# Patient Record
Sex: Male | Born: 1955 | Race: White | Hispanic: No | Marital: Married | Smoking: Never smoker
Health system: Southern US, Community
[De-identification: ages and names within clinical notes are randomized; demographics above are authoritative.]

## PROBLEM LIST (undated history)

## (undated) DIAGNOSIS — E785 Hyperlipidemia, unspecified: Secondary | ICD-10-CM

## (undated) DIAGNOSIS — B019 Varicella without complication: Secondary | ICD-10-CM

## (undated) DIAGNOSIS — J302 Other seasonal allergic rhinitis: Secondary | ICD-10-CM

## (undated) HISTORY — DX: Varicella without complication: B01.9

## (undated) HISTORY — PX: SKULL FRACTURE ELEVATION: SHX781

## (undated) HISTORY — DX: Other seasonal allergic rhinitis: J30.2

## (undated) HISTORY — DX: Hyperlipidemia, unspecified: E78.5

---

## 2006-01-18 ENCOUNTER — Ambulatory Visit: Payer: Self-pay | Admitting: Internal Medicine

## 2007-05-18 ENCOUNTER — Ambulatory Visit: Payer: Self-pay | Admitting: Internal Medicine

## 2007-05-18 ENCOUNTER — Encounter: Payer: Self-pay | Admitting: Internal Medicine

## 2007-05-18 DIAGNOSIS — M79609 Pain in unspecified limb: Secondary | ICD-10-CM | POA: Insufficient documentation

## 2007-05-24 ENCOUNTER — Ambulatory Visit: Payer: Self-pay | Admitting: Internal Medicine

## 2007-06-08 ENCOUNTER — Encounter: Admission: RE | Admit: 2007-06-08 | Discharge: 2007-06-08 | Payer: Self-pay | Admitting: Internal Medicine

## 2007-06-11 ENCOUNTER — Telehealth (INDEPENDENT_AMBULATORY_CARE_PROVIDER_SITE_OTHER): Payer: Self-pay | Admitting: *Deleted

## 2007-06-15 LAB — CONVERTED CEMR LAB
Albumin: 3.6 g/dL (ref 3.5–5.2)
Basophils Absolute: 0 10*3/uL (ref 0.0–0.1)
Bilirubin, Direct: 0.1 mg/dL (ref 0.0–0.3)
Cholesterol: 160 mg/dL (ref 0–200)
Eosinophils Absolute: 0.2 10*3/uL (ref 0.0–0.6)
Eosinophils Relative: 3.1 % (ref 0.0–5.0)
GFR calc Af Amer: 101 mL/min
GFR calc non Af Amer: 84 mL/min
Glucose, Bld: 89 mg/dL (ref 70–99)
HCT: 40.6 % (ref 39.0–52.0)
Lymphocytes Relative: 24.6 % (ref 12.0–46.0)
MCHC: 35.1 g/dL (ref 30.0–36.0)
MCV: 89.9 fL (ref 78.0–100.0)
Neutro Abs: 4.7 10*3/uL (ref 1.4–7.7)
Neutrophils Relative %: 64.9 % (ref 43.0–77.0)
PSA: 1.1 ng/mL (ref 0.10–4.00)
Platelets: 276 10*3/uL (ref 150–400)
RBC: 4.52 M/uL (ref 4.22–5.81)
Sodium: 141 meq/L (ref 135–145)
TSH: 2.54 microintl units/mL (ref 0.35–5.50)
Total CHOL/HDL Ratio: 4.1
Triglycerides: 89 mg/dL (ref 0–149)
WBC: 7.2 10*3/uL (ref 4.5–10.5)

## 2007-07-24 ENCOUNTER — Telehealth (INDEPENDENT_AMBULATORY_CARE_PROVIDER_SITE_OTHER): Payer: Self-pay | Admitting: *Deleted

## 2007-08-06 ENCOUNTER — Encounter: Payer: Self-pay | Admitting: Internal Medicine

## 2007-08-10 ENCOUNTER — Encounter: Payer: Self-pay | Admitting: Internal Medicine

## 2008-03-19 IMAGING — US US SCROTUM
1 series · 14 of 25 positions shown · non-contrast
Comparison: none

CLINICAL DATA: Groin pain.  Rule out hydrocele.
 SCROTAL ULTRASOUND:
TECHNIQUE: Complete ultrasound examination of the testicles, epididymis, and other scrotal structures was performed.

[Series 1: unknown · 0.09mm/px · 14 of 32 slices shown]
[im 1/32]
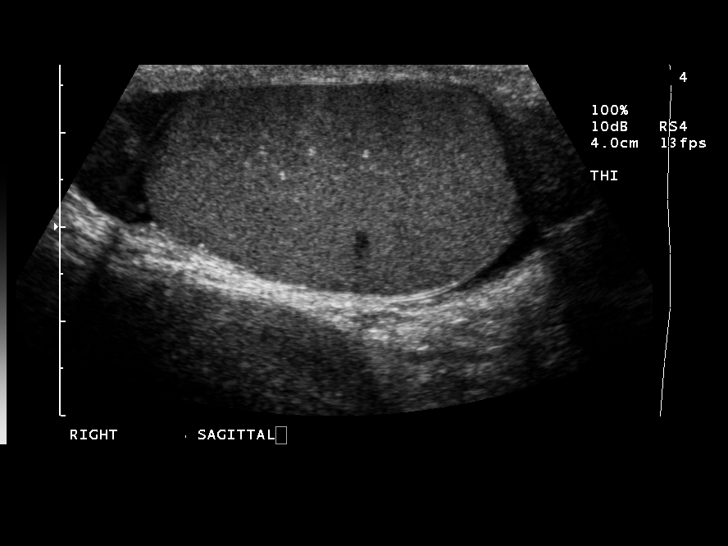
[im 3/32]
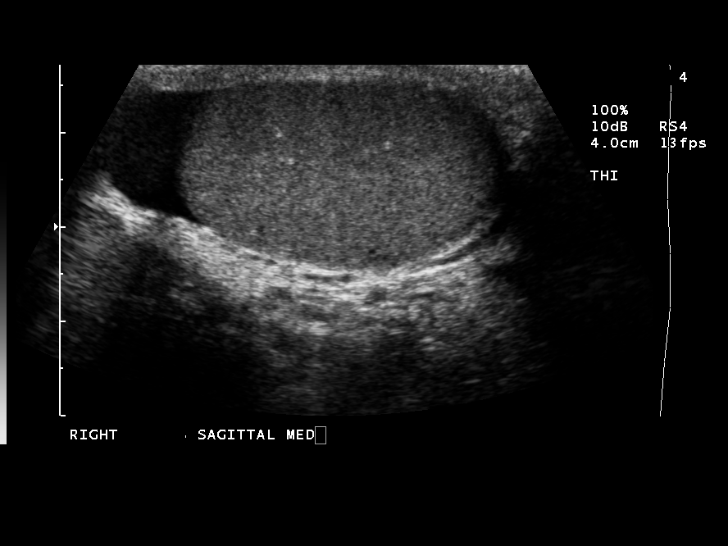
[im 6/32]
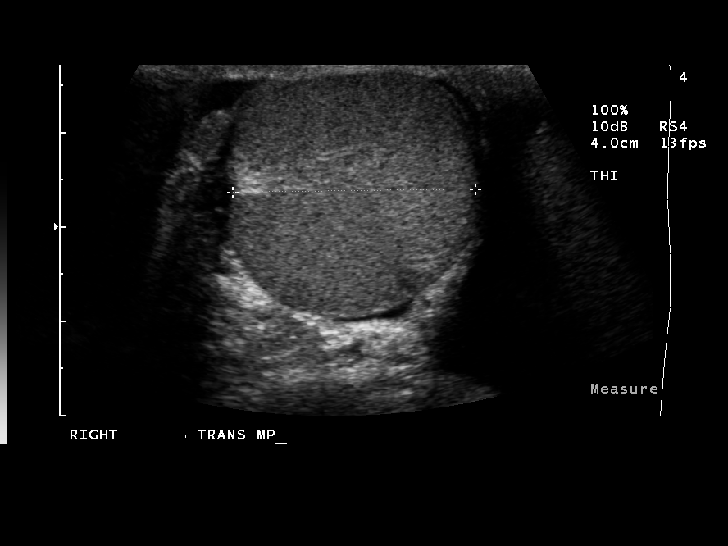
[im 8/32]
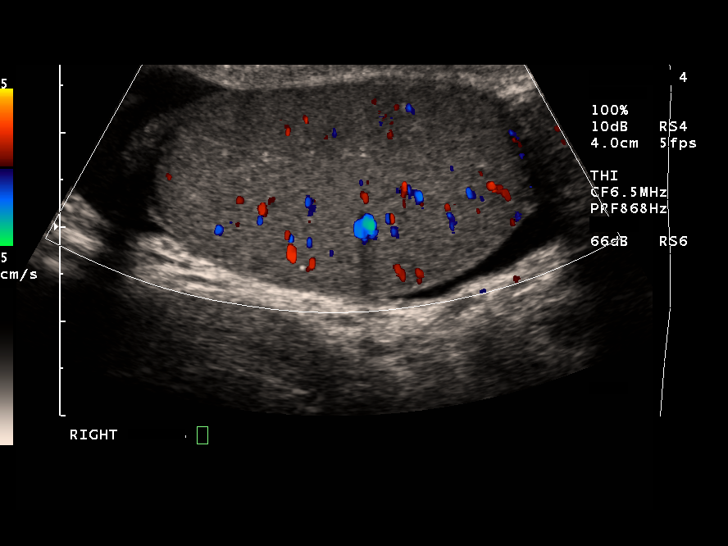
[im 11/32]
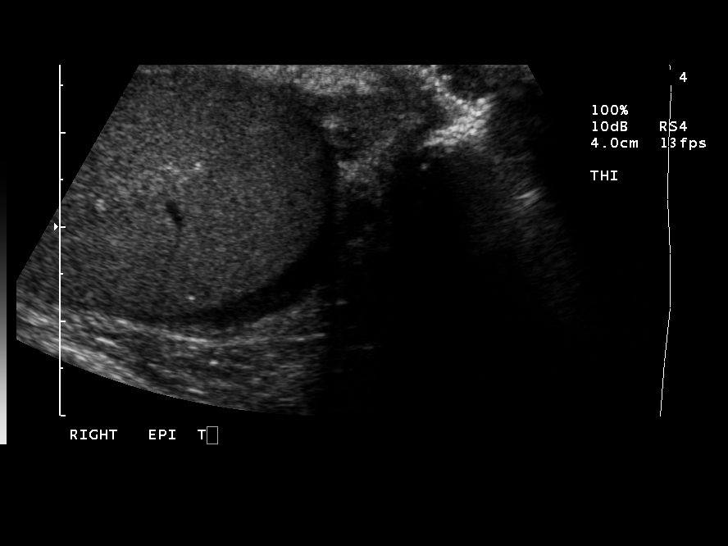
[im 12/32]
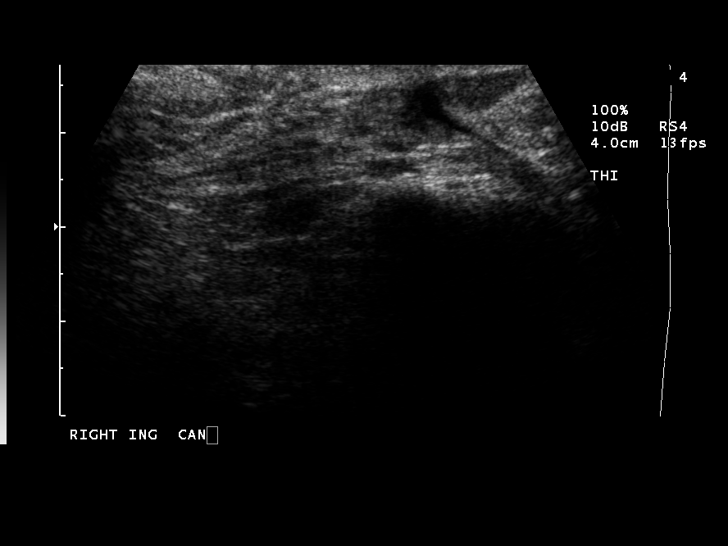
[im 15/32]
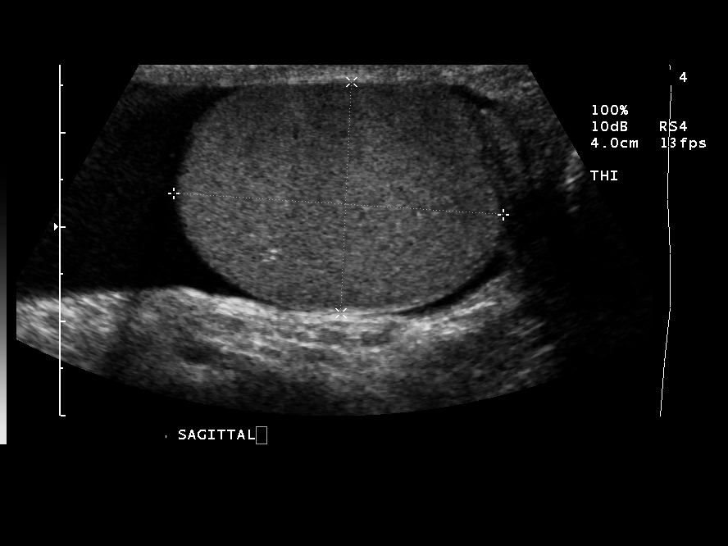
[im 17/32]
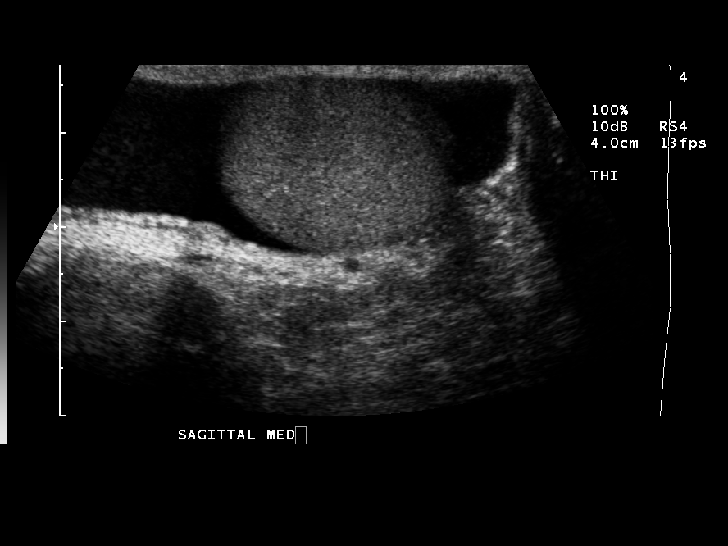
[im 20/32]
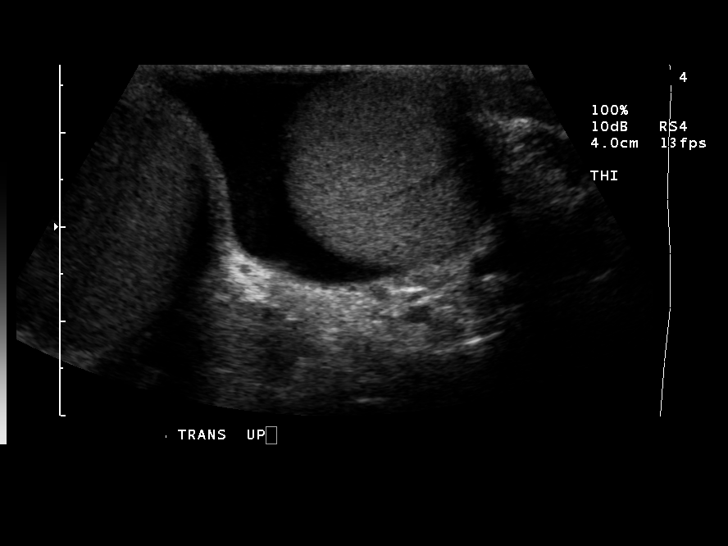
[im 21/32]
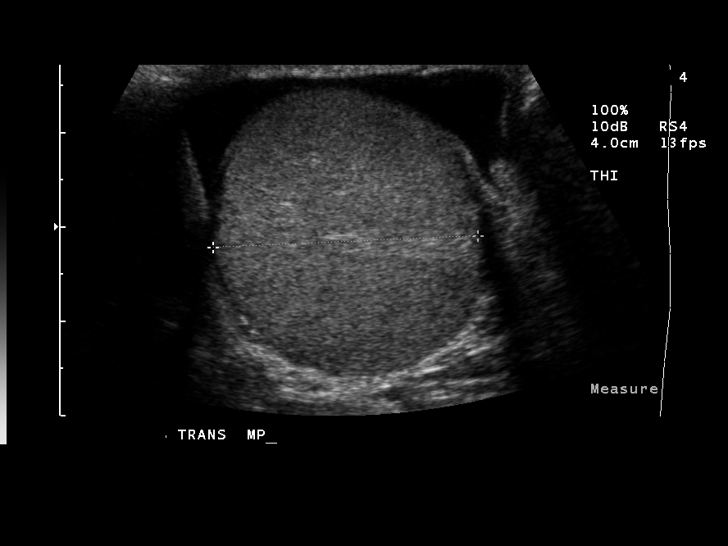
[im 24/32]
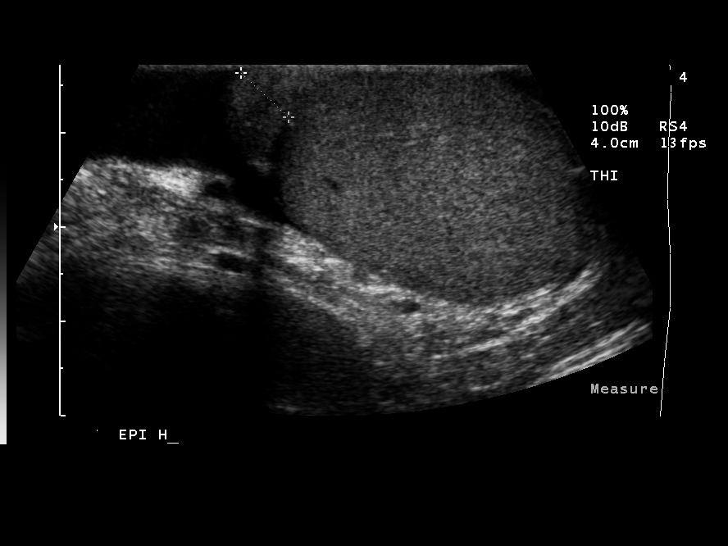
[im 26/32]
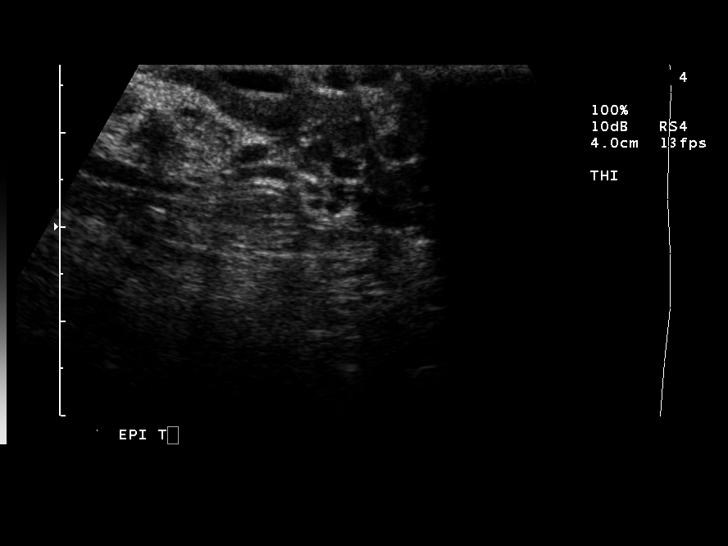
[im 29/32]
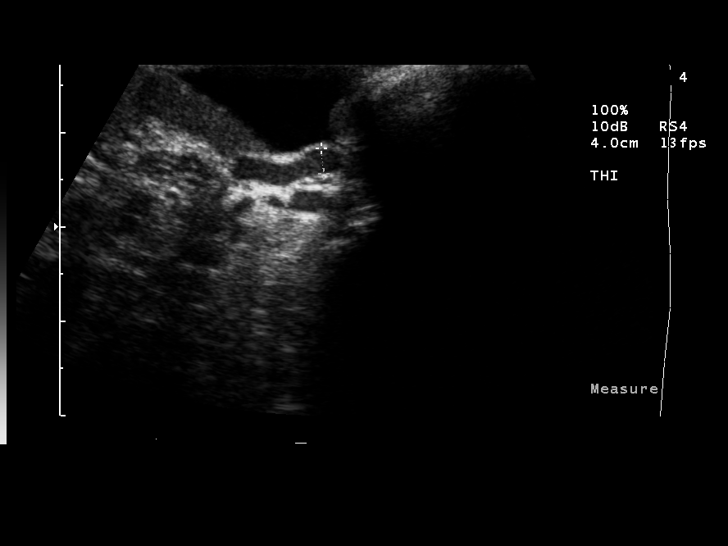
[im 32/32]
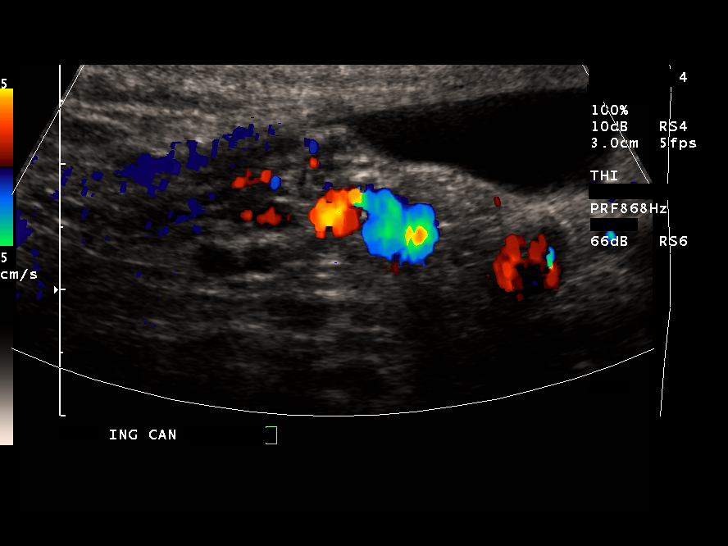

[14 of 25 positions shown; findings below may reference images not displayed]

FINDINGS: The right testicle measures 3.9 x 3.4 x 2.6 cm and appears normal without mass.  Normal blood flow by Doppler.  There is a small right hydrocele.  
 The left testicle measures 3.5 x 2.6 x 2.8 cm and appears normal without mass.  There is normal flow to the left testicle by Doppler.  There is a moderate left hydrocele.  There is a moderate varicocele on the left with increased blood flow with Valsalva maneuver.
IMPRESSION: Bilateral hydrocele, left greater than right.  Left varicocele.

## 2012-11-01 ENCOUNTER — Ambulatory Visit (INDEPENDENT_AMBULATORY_CARE_PROVIDER_SITE_OTHER): Payer: 59 | Admitting: Family Medicine

## 2012-11-01 VITALS — BP 122/79 | HR 64 | Temp 98.3°F | Resp 16 | Ht 70.0 in | Wt 181.0 lb

## 2012-11-01 DIAGNOSIS — S01512A Laceration without foreign body of oral cavity, initial encounter: Secondary | ICD-10-CM

## 2012-11-01 DIAGNOSIS — S01502A Unspecified open wound of oral cavity, initial encounter: Secondary | ICD-10-CM

## 2012-11-01 DIAGNOSIS — K146 Glossodynia: Secondary | ICD-10-CM

## 2012-11-01 DIAGNOSIS — Z23 Encounter for immunization: Secondary | ICD-10-CM

## 2012-11-01 MED ORDER — PENICILLIN V POTASSIUM 500 MG PO TABS: 500.0000 mg | ORAL_TABLET | Freq: Three times a day (TID) | ORAL | Status: DC

## 2012-11-01 MED ORDER — PREDNISONE 20 MG PO TABS: ORAL_TABLET | ORAL | Status: DC

## 2012-11-01 NOTE — Progress Notes (Signed)
Patient ID: LOT MEDFORD MRN: [id number], DOB: April 14, 1956, 56 y.o. Date of Encounter: 11/01/2012, 9:40 AM  Primary Physician: No primary provider on file.  Chief Complaint: Tongue laceration  HPI: 56 y.o. year old male with history below presents with laceration of the tongue. Injury occurred this morning around 6:30 AM while he was eating his shredded wheat. He noticed some blood initially but continued on with his morning. When he arrived at work he continued to notice some blood and went into the bathroom. It was at that time that he noticed a flap along the middle of his tongue that continues to bleed some. The flap continues to bother him. No difficulty breathing or swallowing. Continues to taste blood. Has not noticed that his tongue has swollen. No difficulty talking. He is not sure when his last tetanus vaccine was.    History reviewed. No pertinent past medical history.   Home Meds: Prior to Admission medications   Not on File    Allergies: No Known Allergies  History   Social History  . Marital Status: Single    Spouse Name: N/A    Number of Children: N/A  . Years of Education: N/A   Occupational History  . Not on file.   Social History Main Topics  . Smoking status: Never Smoker   . Smokeless tobacco: Not on file  . Alcohol Use: No  . Drug Use: No  . Sexually Active: Yes     Comment: married   Other Topics Concern  . Not on file   Social History Narrative  . No narrative on file     Review of Systems: Constitutional: negative for chills, fever, night sweats, weight changes, or fatigue  HEENT:see above Respiratory: negative for cough, wheezing, shortness of breath, dyspnea, or hemoptysis    Physical Exam: Blood pressure 142/80, pulse 70, temperature 98.3 F (36.8 C), temperature source Oral, resp. rate 16, height 5\' 10"  (1.778 m), weight 181 lb (82.101 kg), SpO2 99.00%., Body mass index is 25.97 kg/(m^2). General: Well developed, well nourished, in  no acute distress. Head: Normocephalic, atraumatic, eyes without discharge, sclera non-icteric, nares are without discharge. Bilateral auditory canals clear, TM's are without perforation, pearly grey and translucent with reflective cone of light bilaterally. Tongue with laceration and flap midline. Small amount of active bleeding. Dried blood lateral to laceration. No damage to teeth. Oral cavity moist, posterior pharynx without exudate, erythema, peritonsillar abscess, or post nasal drip. Posterior pharynx without erythema or swelling. Neck: Supple. No thyromegaly. Full ROM. No lymphadenopathy. No stridor.   Lungs: Clear bilaterally to auscultation without wheezes, rales, or rhonchi. Breathing is unlabored. Heart: RRR with S1 S2. No murmurs, rubs, or gallops appreciated. Msk:  Strength and tone normal for age. Extremities/Skin: Warm and dry. No clubbing or cyanosis. No edema. No rashes or suspicious lesions. Neuro: Alert and oriented X 3. Moves all extremities spontaneously. Gait is normal. CNII-XII grossly in tact. Psych:  Responds to questions appropriately with a normal affect.     PROCEDURE NOTE: Verbal consent obtained. Sterile technique employed. Mouth rinsed out with 3 bottles of water. Numbing: Anesthesia obtained with 2% plain lidocaine 1.5 cc.   Wound explored, no deep structures involved, no foreign bodies.   Wound repaired with # 2 simple interrupted sutures 4-0 vicryl. Wound loosely approximated to allow for STS. Hemostasis obtained.   Wound care instructions including precautions covered with patient.      ASSESSMENT AND PLAN:  56 y.o. year old male with tongue  laceration. -Laceration repaired per above -TDaP today -Penicillin VK 500 mg 1 po tid #30 no RF -Prednisone 20 mg #12 3x2, 2x2, 1x2 no RF -Patient did have two pre-vagal episodes while placing sutures. Never with vagal episode. BP and pulse at discharge 122/79, 64. At discharge patient reports that he feels  fine. -Patient given fast track to recheck either on 11/9 or 11/10.  -RTC precautions given -Angioedema/tongue swelling precautions discussed in detail -Questions/concerns answered -Patient discussed with and examined by Dr. Patsy Lager   Signed, Eula Listen, PA-C 11/01/2012 9:40 AM

## 2017-10-11 ENCOUNTER — Ambulatory Visit: Payer: Self-pay | Admitting: Family Medicine

## 2018-09-12 ENCOUNTER — Encounter: Payer: Self-pay | Admitting: Family Medicine

## 2018-09-12 ENCOUNTER — Ambulatory Visit (INDEPENDENT_AMBULATORY_CARE_PROVIDER_SITE_OTHER): Payer: 59 | Admitting: Family Medicine

## 2018-09-12 VITALS — BP 136/86 | HR 60 | Temp 98.0°F | Ht 69.5 in | Wt 183.4 lb

## 2018-09-12 DIAGNOSIS — Z23 Encounter for immunization: Secondary | ICD-10-CM | POA: Diagnosis not present

## 2018-09-12 DIAGNOSIS — Z125 Encounter for screening for malignant neoplasm of prostate: Secondary | ICD-10-CM

## 2018-09-12 DIAGNOSIS — Z114 Encounter for screening for human immunodeficiency virus [HIV]: Secondary | ICD-10-CM | POA: Diagnosis not present

## 2018-09-12 DIAGNOSIS — E785 Hyperlipidemia, unspecified: Secondary | ICD-10-CM | POA: Diagnosis not present

## 2018-09-12 DIAGNOSIS — Z1159 Encounter for screening for other viral diseases: Secondary | ICD-10-CM | POA: Diagnosis not present

## 2018-09-12 DIAGNOSIS — Z0000 Encounter for general adult medical examination without abnormal findings: Secondary | ICD-10-CM

## 2018-09-12 DIAGNOSIS — Z1211 Encounter for screening for malignant neoplasm of colon: Secondary | ICD-10-CM

## 2018-09-12 LAB — COMPREHENSIVE METABOLIC PANEL
ALBUMIN: 3.8 g/dL (ref 3.5–5.2)
ALT: 12 U/L (ref 0–53)
AST: 16 U/L (ref 0–37)
Alkaline Phosphatase: 65 U/L (ref 39–117)
BUN: 20 mg/dL (ref 6–23)
CHLORIDE: 107 meq/L (ref 96–112)
CO2: 28 mEq/L (ref 19–32)
CREATININE: 0.95 mg/dL (ref 0.40–1.50)
Calcium: 9 mg/dL (ref 8.4–10.5)
GFR: 85.23 mL/min (ref >60.00)
Glucose, Bld: 86 mg/dL (ref 70–99)
Potassium: 4.2 mEq/L (ref 3.5–5.1)
SODIUM: 140 meq/L (ref 135–145)
TOTAL PROTEIN: 6.6 g/dL (ref 6.0–8.3)
Total Bilirubin: 0.4 mg/dL (ref 0.2–1.2)

## 2018-09-12 LAB — POC URINALSYSI DIPSTICK (AUTOMATED)
Bilirubin, UA: NEGATIVE
Glucose, UA: NEGATIVE
KETONES UA: NEGATIVE
LEUKOCYTES UA: NEGATIVE
Nitrite, UA: NEGATIVE
PH UA: 5.5 (ref 5.0–8.0)
PROTEIN UA: NEGATIVE
RBC UA: NEGATIVE
Spec Grav, UA: 1.025 (ref 1.010–1.025)
Urobilinogen, UA: 0.2 E.U./dL

## 2018-09-12 LAB — LIPID PANEL
CHOLESTEROL: 153 mg/dL (ref 0–200)
HDL: 40.5 mg/dL (ref >39.00)
LDL CALC: 92 mg/dL (ref 0–99)
NonHDL: 112.18
Total CHOL/HDL Ratio: 4
Triglycerides: 102 mg/dL (ref 0.0–149.0)
VLDL: 20.4 mg/dL (ref 0.0–40.0)

## 2018-09-12 LAB — CBC
HCT: 42.9 % (ref 39.0–52.0)
Hemoglobin: 14.4 g/dL (ref 13.0–17.0)
MCHC: 33.5 g/dL (ref 30.0–36.0)
MCV: 91 fl (ref 78.0–100.0)
PLATELETS: 264 10*3/uL (ref 150.0–400.0)
RBC: 4.71 Mil/uL (ref 4.22–5.81)
RDW: 13.9 % (ref 11.5–15.5)
WBC: 6.7 10*3/uL (ref 4.0–10.5)

## 2018-09-12 LAB — PSA: PSA: 3.52 ng/mL (ref 0.10–4.00)

## 2018-09-12 NOTE — Addendum Note (Signed)
Addended by: London SheerFRIZZELL, BAILEY T on: 09/12/2018 09:07 AM   Modules accepted: Orders
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
## 2018-09-12 NOTE — Addendum Note (Signed)
Addended by: Heide SparkSOUTHERN HIZER, JAMIE M on: 09/12/2018 09:06 AM   Modules accepted: Orders

## 2018-09-12 NOTE — Patient Instructions (Addendum)
Health Maintenance Due  Topic Date Due  . Hepatitis C Screening - ordered today 1956/07/10  . HIV Screening - ordered today 02/27/1971  . COLONOSCOPY - We will call you within two weeks about your referral to Dr. Elnoria HowardHung of GI. If you do not hear within 3 weeks, give us a call.  02/26/2006   Advise you to get back in with a new dentist.   Tdap today.   Declined flu.   Please stop by lab before you go

## 2018-09-13 LAB — HIV ANTIBODY (ROUTINE TESTING W REFLEX): HIV: NONREACTIVE

## 2018-09-13 LAB — HEPATITIS C ANTIBODY
HEP C AB: NONREACTIVE
SIGNAL TO CUT-OFF: 0.02 (ref <1.00)

## 2018-09-14 ENCOUNTER — Telehealth: Payer: Self-pay | Admitting: Family Medicine

## 2018-09-14 NOTE — Telephone Encounter (Signed)
Left message on voicemail for pt to return call to office for results.    

## 2018-09-14 NOTE — Telephone Encounter (Signed)
Pt called to get results.  Copied from CRM [phone]. Topic: Quick Communication - Lab Results >> Sep 14, 2018 11:12 AM [place] Hizer, [place], [place] wrote: Called patient to inform them of 09/13/18 lab results. When patient returns call, triage nurse may disclose results.

## 2018-10-02 NOTE — Telephone Encounter (Signed)
Pt given lab results per notes of Dr Durene Cal on 09/13/18. Pt verbalized understanding. Pt will call back later with if he wants the 3-6 month appointment.  Unable to document in result note. Result note not routed.

## 2018-10-12 ENCOUNTER — Telehealth: Payer: Self-pay | Admitting: Family Medicine

## 2018-10-12 NOTE — Telephone Encounter (Signed)
Copied from CRM [id number]. Topic: Quick Communication - See Telephone Encounter >> Oct 12, 2018 12:14 PM Windy Kalata, NT wrote: CRM for notification. See Telephone encounter for: 10/12/18.  Patient is calling and states when he received his lab results from 09/12/18 visit he was told his PSA was high and that if he wanted to come back and get it rechecked he could or he could wait. He wants a nurse to give him a call and explain what exactly that is and when he should have it retested.

## 2018-10-12 NOTE — Telephone Encounter (Signed)
See note

## 2018-10-18 NOTE — Telephone Encounter (Signed)
See note

## 2018-10-18 NOTE — Telephone Encounter (Signed)
Per the lab results dated 09/12/18:  PSA has increased by 2.5 in 10-11 years. You do have an enlarged prostate which can cause this. If you would like we could do a 3-6 month under BPH with nocturia (enlarged prostate) repeat to make sure this isnt trending up or we can simply do a rectal and PSA check next year.   Called patient and left a voicemail message asking for a return phone call

## 2018-10-18 NOTE — Telephone Encounter (Signed)
Patient called, left VM to return call to the office to speak to a TN about PSA lab results.

## 2018-10-18 NOTE — Telephone Encounter (Signed)
Patient return call, advised of the note below. I explained that he can either come in for a repeat PSA blood level in 3-6 months or come back in 1 year for a physical/labs, which will include a rectal exam and PSA level at that time. He asks what does the 2.5 mean, I explained that based on his level 11 years ago, his number last month was 2.5 points higher, he verbalized understanding and says he wants to come in 3 months to have the labs repeated. I advised someone from the office will call with the lab appointment once the order has been put in.

## 2018-10-22 ENCOUNTER — Other Ambulatory Visit: Payer: Self-pay

## 2018-10-22 DIAGNOSIS — R972 Elevated prostate specific antigen [PSA]: Secondary | ICD-10-CM

## 2018-10-22 NOTE — Telephone Encounter (Signed)
Order entered for future PSA.

## 2018-12-12 ENCOUNTER — Other Ambulatory Visit (INDEPENDENT_AMBULATORY_CARE_PROVIDER_SITE_OTHER): Payer: 59

## 2018-12-12 DIAGNOSIS — R972 Elevated prostate specific antigen [PSA]: Secondary | ICD-10-CM | POA: Diagnosis not present

## 2018-12-12 LAB — PSA: PSA: 3.86 ng/mL (ref 0.10–4.00)

## 2018-12-21 ENCOUNTER — Other Ambulatory Visit: Payer: Self-pay

## 2018-12-21 ENCOUNTER — Telehealth: Payer: Self-pay | Admitting: Family Medicine

## 2018-12-21 DIAGNOSIS — R972 Elevated prostate specific antigen [PSA]: Secondary | ICD-10-CM

## 2018-12-21 NOTE — Telephone Encounter (Signed)
Called and spoke with patient. I reviewed over his lab results with him and I placed a referral to Urology. Patient is aware to look for their office to call him to schedule an appointment

## 2018-12-21 NOTE — Telephone Encounter (Signed)
Copied from CRM [phone]. Topic: General - Other >> Dec 21, 2018 12:17 PM Fanny BienIlderton, Jessica L wrote: Reason for CRM: pt requesting labs from 12/12/18. Please advise

## 2019-02-08 DIAGNOSIS — R972 Elevated prostate specific antigen [PSA]: Secondary | ICD-10-CM | POA: Diagnosis not present

## 2019-06-11 ENCOUNTER — Telehealth: Payer: Self-pay | Admitting: Physical Therapy

## 2019-06-11 DIAGNOSIS — R972 Elevated prostate specific antigen [PSA]: Secondary | ICD-10-CM

## 2019-06-11 NOTE — Telephone Encounter (Signed)
We referred him to urology last year- what is the status of this referral?  I was unable to locate records of urology visit.  Patient may be able to give perspective on this as well

## 2019-06-11 NOTE — Telephone Encounter (Signed)
Copied from [place] [id number]. Topic: General - Other >> Jun 11, 2019 12:11 PM Marin Olp L wrote: Reason for CRM: PSA blood draw being requested. Please call once order is placed.

## 2019-06-12 NOTE — Telephone Encounter (Signed)
Called pt and left VM to call the office.  

## 2019-06-13 NOTE — Telephone Encounter (Signed)
Forwarding to Dr. Hunter to advise.  

## 2019-06-13 NOTE — Telephone Encounter (Signed)
Future lab order placed. Called pt and schedule lab visit for tomorrow at 11:30.

## 2019-06-13 NOTE — Telephone Encounter (Signed)
Pt was not able to go to urology due to work/scheduling issues. He said he wanted to do labs again with Dr. Yong Channel and then will f/u with the urologist after that if needed. Please advise.

## 2019-06-13 NOTE — Telephone Encounter (Signed)
May order PSA under elevated PSA Lab Results  Component Value Date   PSA 3.86 12/12/2018   PSA 3.52 09/12/2018   PSA 1.10 05/24/2007

## 2019-06-13 NOTE — Addendum Note (Signed)
Addended by: Jasper Loser on: 06/13/2019 04:01 PM   Modules accepted: Orders

## 2019-06-14 ENCOUNTER — Other Ambulatory Visit (INDEPENDENT_AMBULATORY_CARE_PROVIDER_SITE_OTHER): Payer: 59

## 2019-06-14 ENCOUNTER — Other Ambulatory Visit: Payer: Self-pay

## 2019-06-14 DIAGNOSIS — R972 Elevated prostate specific antigen [PSA]: Secondary | ICD-10-CM

## 2019-06-14 LAB — PSA: PSA: 5.15 ng/mL — ABNORMAL HIGH (ref 0.10–4.00)

## 2019-06-17 ENCOUNTER — Other Ambulatory Visit: Payer: Self-pay | Admitting: Physical Therapy

## 2019-06-17 ENCOUNTER — Ambulatory Visit: Payer: Self-pay

## 2019-06-17 DIAGNOSIS — R972 Elevated prostate specific antigen [PSA]: Secondary | ICD-10-CM

## 2019-06-17 NOTE — Telephone Encounter (Signed)
Provided lab notes of 06/14/19 Pt.  Voices understanding.  Had to cancel  Appt.  Will reschedule.

## 2021-04-22 LAB — HM COLONOSCOPY

## 2021-04-23 ENCOUNTER — Encounter: Payer: Self-pay | Admitting: Family Medicine
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
## 2021-08-27 ENCOUNTER — Ambulatory Visit (INDEPENDENT_AMBULATORY_CARE_PROVIDER_SITE_OTHER): Payer: 59 | Admitting: Family Medicine

## 2021-08-27 ENCOUNTER — Encounter: Payer: Self-pay | Admitting: Family Medicine

## 2021-08-27 ENCOUNTER — Other Ambulatory Visit: Payer: Self-pay

## 2021-08-27 VITALS — BP 137/83 | HR 61 | Temp 98.5°F | Ht 69.5 in | Wt 195.2 lb

## 2021-08-27 DIAGNOSIS — Z0000 Encounter for general adult medical examination without abnormal findings: Secondary | ICD-10-CM

## 2021-08-27 DIAGNOSIS — R972 Elevated prostate specific antigen [PSA]: Secondary | ICD-10-CM | POA: Diagnosis not present

## 2021-08-27 DIAGNOSIS — E785 Hyperlipidemia, unspecified: Secondary | ICD-10-CM

## 2021-08-27 NOTE — Patient Instructions (Addendum)
Health Maintenance Due  Topic Date Due   Zoster Vaccines- Shingrix (1 of 2) - hold off shingles shot at this time. Never done   PNA vac Low Risk Adult (1 of 2 - PCV13) - hold off Prevnar 20 shot at this time  Never done  -Hold off COVID shot at this time.  -Hold off flu shot at this time.  We will call you within two weeks about your referral to Urology. If you do not hear within 2 weeks, give [place] a call.   Please consider trying Melatonin  1-3 mg 30 minutes before bed to help with sleep for up to 2 weeks  Please stop by lab before you go- we did labs today- should not need work labs If you have mychart- we will send your results within 3 business days of [place] receiving them.  If you do not have mychart- we will call you about results within 5 business days of [place] receiving them.  *please also note that you will see labs on mychart as soon as they post. I will later go in and write notes on them- will say "notes from Dr. Durene Cal"  Recommended follow up: Return in about 1 year (around 08/27/2022) for physical or sooner if needed.

## 2021-08-27 NOTE — Addendum Note (Signed)
Addended by: Lorn Junes on: 08/27/2021 03:40 PM   Modules accepted: Orders

## 2021-08-28 LAB — COMPREHENSIVE METABOLIC PANEL
AG Ratio: 1.5 (calc) (ref 1.0–2.5)
ALT: 13 U/L (ref 9–46)
AST: 18 U/L (ref 10–35)
Albumin: 3.8 g/dL (ref 3.6–5.1)
Alkaline phosphatase (APISO): 51 U/L (ref 35–144)
BUN: 16 mg/dL (ref 7–25)
CO2: 28 mmol/L (ref 20–32)
Calcium: 9.4 mg/dL (ref 8.6–10.3)
Chloride: 106 mmol/L (ref 98–110)
Creat: 1.07 mg/dL (ref 0.70–1.35)
Globulin: 2.6 g/dL (calc) (ref 1.9–3.7)
Glucose, Bld: 91 mg/dL (ref 65–99)
Potassium: 4.2 mmol/L (ref 3.5–5.3)
Sodium: 140 mmol/L (ref 135–146)
Total Bilirubin: 0.6 mg/dL (ref 0.2–1.2)
Total Protein: 6.4 g/dL (ref 6.1–8.1)

## 2021-08-28 LAB — LIPID PANEL
Cholesterol: 182 mg/dL (ref <200)
HDL: 44 mg/dL (ref >=40)
LDL Cholesterol (Calc): 116 mg/dL (calc) — ABNORMAL HIGH
Non-HDL Cholesterol (Calc): 138 mg/dL (calc) — ABNORMAL HIGH (ref <130)
Total CHOL/HDL Ratio: 4.1 (calc) (ref <5.0)
Triglycerides: 114 mg/dL (ref <150)

## 2021-08-28 LAB — CBC WITH DIFFERENTIAL/PLATELET
Absolute Monocytes: 593 cells/uL (ref 200–950)
Basophils Absolute: 68 cells/uL (ref 0–200)
Basophils Relative: 0.9 %
Eosinophils Absolute: 278 cells/uL (ref 15–500)
Eosinophils Relative: 3.7 %
HCT: 45.3 % (ref 38.5–50.0)
Hemoglobin: 14.4 g/dL (ref 13.2–17.1)
Lymphs Abs: 2138 cells/uL (ref 850–3900)
MCH: 29.4 pg (ref 27.0–33.0)
MCHC: 31.8 g/dL — ABNORMAL LOW (ref 32.0–36.0)
MCV: 92.6 fL (ref 80.0–100.0)
MPV: 10.8 fL (ref 7.5–12.5)
Monocytes Relative: 7.9 %
Neutro Abs: 4425 cells/uL (ref 1500–7800)
Neutrophils Relative %: 59 %
Platelets: 261 10*3/uL (ref 140–400)
RBC: 4.89 10*6/uL (ref 4.20–5.80)
RDW: 12.4 % (ref 11.0–15.0)
Total Lymphocyte: 28.5 %
WBC: 7.5 10*3/uL (ref 3.8–10.8)

## 2021-08-28 LAB — PSA: PSA: 4.58 ng/mL — ABNORMAL HIGH (ref <=4.00)

## 2021-10-29 ENCOUNTER — Encounter: Payer: Self-pay | Admitting: Family Medicine

## 2021-10-29 NOTE — Telephone Encounter (Signed)
Pt's wife called wanting an update on forms. Pt wanted to know if Dr Durene Cal has received forms. Please Advise.

## 2021-11-01 NOTE — Telephone Encounter (Signed)
Have these been completed

## 2022-09-19 ENCOUNTER — Encounter: Payer: Self-pay | Admitting: *Deleted

## 2022-12-08 ENCOUNTER — Encounter: Payer: Self-pay | Admitting: *Deleted

## 2023-05-05 ENCOUNTER — Other Ambulatory Visit: Payer: Self-pay | Admitting: Urology

## 2023-05-05 DIAGNOSIS — R972 Elevated prostate specific antigen [PSA]: Secondary | ICD-10-CM

## 2023-06-22 ENCOUNTER — Ambulatory Visit
Admission: RE | Admit: 2023-06-22 | Discharge: 2023-06-22 | Disposition: A | Discharge status: Home / Self Care | Payer: Medicare Other | Source: Ambulatory Visit | Attending: Urology | Admitting: Urology

## 2023-06-22 DIAGNOSIS — R972 Elevated prostate specific antigen [PSA]: Secondary | ICD-10-CM

## 2023-06-22 MED ORDER — GADOPICLENOL 0.5 MMOL/ML IV SOLN
8.0000 mL | Freq: Once | INTRAVENOUS | Status: AC | PRN
  Administered 2023-06-22: 8 mL via INTRAVENOUS

## 2024-11-06 ENCOUNTER — Telehealth: Payer: Self-pay

## 2024-11-06 NOTE — Telephone Encounter (Signed)
 Patient is overdue for an appointment. LMOM for patient to call and schedule.

## 2024-11-12 ENCOUNTER — Telehealth: Payer: Self-pay

## 2024-11-12 NOTE — Telephone Encounter (Signed)
 Patient is overdue for an appointment with PCP. LMOM for patient to call and schedule.

## 2024-11-15 ENCOUNTER — Telehealth: Payer: Self-pay | Admitting: Family Medicine

## 2024-11-15 NOTE — Telephone Encounter (Signed)
Happy to see him back

## 2024-11-15 NOTE — Telephone Encounter (Signed)
 Please see note and advise.   Copied from CRM [id number]. Topic: Appointments - Scheduling Inquiry for Clinic >> Nov 15, 2024 12:30 PM Melissa C wrote: Reason for CRM: patient returning a call that he recently received. I saw where he he had received a call for scheduling and that he is overdue for scheduling, however since he has not been scheduled with PCP for over 3 years he is now considered a new patient and Dr. Katrinka is not taking new patients. I had placed patient on hold while looking through his chart and he hung up. I attempted to call him back and he did not answer. Please give patient a call back if Dr. Katrinka is able to see him as a patient or if he needs to see a different provider.

## 2024-11-19 NOTE — Telephone Encounter (Signed)
 LVM to schedule New Patient appt the week of Jan 13, 2025. Please transfer to [employer]

## 2024-11-25 NOTE — Telephone Encounter (Signed)
 LVM to schedule the week of 01/13/25 with Hunter. Please transfer to FRONT OFFICE.

## 2024-11-27 NOTE — Telephone Encounter (Signed)
 Patient is scheduled for NP appointment 01/15/24

## 2025-01-14 ENCOUNTER — Ambulatory Visit: Admitting: Family Medicine

## 2025-01-14 ENCOUNTER — Encounter: Payer: Self-pay | Admitting: Family Medicine

## 2025-01-14 VITALS — BP 142/70 | HR 65 | Temp 98.8°F | Ht 69.5 in | Wt 179.0 lb

## 2025-01-14 DIAGNOSIS — Z0000 Encounter for general adult medical examination without abnormal findings: Secondary | ICD-10-CM | POA: Diagnosis not present

## 2025-01-14 DIAGNOSIS — Z131 Encounter for screening for diabetes mellitus: Secondary | ICD-10-CM

## 2025-01-14 DIAGNOSIS — E785 Hyperlipidemia, unspecified: Secondary | ICD-10-CM | POA: Diagnosis not present

## 2025-01-14 DIAGNOSIS — E663 Overweight: Secondary | ICD-10-CM

## 2025-01-14 NOTE — Patient Instructions (Addendum)
 Ear irrigation on the right- if comes out you are free to go  around right lateral ankle rather extensive rash without silvery plaque- wonder if he's gotten into an itch scratch cycle- advised trial hydrocortisone then cover with Vaseline to lock it in for 10 days twice a day- if not better take 7 day break then let me know - may try stronger steroid. If worsens on hydrocortisone could be fungal but doesn't look obviously fungal- stop if gets worse and let me know . I agree with your wife- biggest thing right now is to QUIT SCRATCHING!  Your blood pressure is higher than I would like in office. I would like for you to buy/use a home cuff to check at least 4x a week. Omron series 3 or silver series is reasonable usually around 35 or $40. Your goal is <135/85.  Update me in 3 weeks by mychart.  - at next visit if you would liek for us  to compare cuffs can bring cuff with you -this could simply be white coat elevated blood pressure    Schedule with dentist and eye doctor  Please stop by lab before you go If you have mychart- we will send your results within 3 business days of us  receiving them.  If you do not have mychart- we will call you about results within 5 business days of us  receiving them.  *please also note that you will see labs on mychart as soon as they post. I will later go in and write notes on them- will say notes from Dr. Katrinka   Recommended follow up: Return in about 1 year (around 01/14/2026) for physical or sooner if needed.Schedule b4 you leave. Unless blood pressure high
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
## 2025-01-15 ENCOUNTER — Ambulatory Visit: Payer: Self-pay | Admitting: Family Medicine

## 2025-01-15 LAB — CBC WITH DIFFERENTIAL/PLATELET
Basophils Absolute: 0.1 K/uL (ref 0.0–0.1)
Basophils Relative: 1.2 % (ref 0.0–3.0)
Eosinophils Absolute: 0.2 K/uL (ref 0.0–0.7)
Eosinophils Relative: 2.5 % (ref 0.0–5.0)
HCT: 41.7 % (ref 39.0–52.0)
Hemoglobin: 14 g/dL (ref 13.0–17.0)
Lymphocytes Relative: 30.2 % (ref 12.0–46.0)
Lymphs Abs: 2.1 K/uL (ref 0.7–4.0)
MCHC: 33.7 g/dL (ref 30.0–36.0)
MCV: 91.8 fl (ref 78.0–100.0)
Monocytes Absolute: 0.6 K/uL (ref 0.1–1.0)
Monocytes Relative: 9.2 % (ref 3.0–12.0)
Neutro Abs: 3.9 K/uL (ref 1.4–7.7)
Neutrophils Relative %: 56.9 % (ref 43.0–77.0)
Platelets: 262 K/uL (ref 150.0–400.0)
RBC: 4.54 Mil/uL (ref 4.22–5.81)
RDW: 13.6 % (ref 11.5–15.5)
WBC: 6.8 K/uL (ref 4.0–10.5)

## 2025-01-15 LAB — COMPREHENSIVE METABOLIC PANEL WITH GFR
ALT: 14 U/L (ref 3–53)
AST: 17 U/L (ref 5–37)
Albumin: 3.9 g/dL (ref 3.5–5.2)
Alkaline Phosphatase: 51 U/L (ref 39–117)
BUN: 16 mg/dL (ref 6–23)
CO2: 30 meq/L (ref 19–32)
Calcium: 9 mg/dL (ref 8.4–10.5)
Chloride: 106 meq/L (ref 96–112)
Creatinine, Ser: 1.04 mg/dL (ref 0.40–1.50)
GFR: 73.58 mL/min
Glucose, Bld: 88 mg/dL (ref 70–99)
Potassium: 4.5 meq/L (ref 3.5–5.1)
Sodium: 140 meq/L (ref 135–145)
Total Bilirubin: 0.3 mg/dL (ref 0.2–1.2)
Total Protein: 6.5 g/dL (ref 6.0–8.3)

## 2025-01-15 LAB — LIPID PANEL
Cholesterol: 175 mg/dL (ref 28–200)
HDL: 42.6 mg/dL
LDL Cholesterol: 100 mg/dL — ABNORMAL HIGH (ref 10–99)
NonHDL: 132.59
Total CHOL/HDL Ratio: 4
Triglycerides: 164 mg/dL — ABNORMAL HIGH (ref 10.0–149.0)
VLDL: 32.8 mg/dL (ref 0.0–40.0)

## 2025-01-15 LAB — HEMOGLOBIN A1C: Hgb A1c MFr Bld: 5.8 % (ref 4.6–6.5)

## 2026-01-16 ENCOUNTER — Encounter: Admitting: Family Medicine
